# Patient Record
Sex: Male | Born: 2000 | Race: Black or African American | Hispanic: No | Marital: Single
Health system: Southern US, Community
[De-identification: ages and names within clinical notes are randomized; demographics above are authoritative.]

---
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
## 2022-03-19 DIAGNOSIS — Z419 Encounter for procedure for purposes other than remedying health state, unspecified: Secondary | ICD-10-CM | POA: Diagnosis not present

## 2022-04-18 DIAGNOSIS — Z419 Encounter for procedure for purposes other than remedying health state, unspecified: Secondary | ICD-10-CM | POA: Diagnosis not present

## 2022-05-19 DIAGNOSIS — Z419 Encounter for procedure for purposes other than remedying health state, unspecified: Secondary | ICD-10-CM | POA: Diagnosis not present

## 2022-06-18 DIAGNOSIS — Z419 Encounter for procedure for purposes other than remedying health state, unspecified: Secondary | ICD-10-CM | POA: Diagnosis not present

## 2022-07-19 DIAGNOSIS — Z419 Encounter for procedure for purposes other than remedying health state, unspecified: Secondary | ICD-10-CM | POA: Diagnosis not present

## 2022-08-19 DIAGNOSIS — Z419 Encounter for procedure for purposes other than remedying health state, unspecified: Secondary | ICD-10-CM | POA: Diagnosis not present

## 2022-09-17 DIAGNOSIS — Z419 Encounter for procedure for purposes other than remedying health state, unspecified: Secondary | ICD-10-CM | POA: Diagnosis not present

## 2022-10-18 DIAGNOSIS — Z419 Encounter for procedure for purposes other than remedying health state, unspecified: Secondary | ICD-10-CM | POA: Diagnosis not present

## 2022-11-17 DIAGNOSIS — Z419 Encounter for procedure for purposes other than remedying health state, unspecified: Secondary | ICD-10-CM | POA: Diagnosis not present

## 2022-12-18 DIAGNOSIS — Z419 Encounter for procedure for purposes other than remedying health state, unspecified: Secondary | ICD-10-CM | POA: Diagnosis not present

## 2023-01-17 DIAGNOSIS — Z419 Encounter for procedure for purposes other than remedying health state, unspecified: Secondary | ICD-10-CM | POA: Diagnosis not present

## 2023-02-17 DIAGNOSIS — Z419 Encounter for procedure for purposes other than remedying health state, unspecified: Secondary | ICD-10-CM | POA: Diagnosis not present

## 2023-03-20 DIAGNOSIS — Z419 Encounter for procedure for purposes other than remedying health state, unspecified: Secondary | ICD-10-CM | POA: Diagnosis not present

## 2023-04-19 DIAGNOSIS — Z419 Encounter for procedure for purposes other than remedying health state, unspecified: Secondary | ICD-10-CM | POA: Diagnosis not present

## 2023-05-20 DIAGNOSIS — Z419 Encounter for procedure for purposes other than remedying health state, unspecified: Secondary | ICD-10-CM | POA: Diagnosis not present

## 2023-06-19 DIAGNOSIS — Z419 Encounter for procedure for purposes other than remedying health state, unspecified: Secondary | ICD-10-CM | POA: Diagnosis not present

## 2023-07-20 DIAGNOSIS — Z419 Encounter for procedure for purposes other than remedying health state, unspecified: Secondary | ICD-10-CM | POA: Diagnosis not present

## 2023-08-20 DIAGNOSIS — Z419 Encounter for procedure for purposes other than remedying health state, unspecified: Secondary | ICD-10-CM | POA: Diagnosis not present

## 2023-09-17 DIAGNOSIS — Z419 Encounter for procedure for purposes other than remedying health state, unspecified: Secondary | ICD-10-CM | POA: Diagnosis not present

## 2023-09-28 DIAGNOSIS — Z419 Encounter for procedure for purposes other than remedying health state, unspecified: Secondary | ICD-10-CM | POA: Diagnosis not present

## 2023-10-29 DIAGNOSIS — Z419 Encounter for procedure for purposes other than remedying health state, unspecified: Secondary | ICD-10-CM | POA: Diagnosis not present

## 2023-11-28 DIAGNOSIS — Z419 Encounter for procedure for purposes other than remedying health state, unspecified: Secondary | ICD-10-CM | POA: Diagnosis not present

## 2023-12-29 DIAGNOSIS — Z419 Encounter for procedure for purposes other than remedying health state, unspecified: Secondary | ICD-10-CM | POA: Diagnosis not present

## 2024-01-28 DIAGNOSIS — Z419 Encounter for procedure for purposes other than remedying health state, unspecified: Secondary | ICD-10-CM | POA: Diagnosis not present

## 2024-02-28 DIAGNOSIS — Z419 Encounter for procedure for purposes other than remedying health state, unspecified: Secondary | ICD-10-CM | POA: Diagnosis not present

## 2024-03-30 DIAGNOSIS — Z419 Encounter for procedure for purposes other than remedying health state, unspecified: Secondary | ICD-10-CM | POA: Diagnosis not present
